# Patient Record
Sex: Female | Born: 1935 | Race: White | Hispanic: No | Marital: Married | Smoking: Never smoker
Health system: Southern US, Community
[De-identification: ages and names within clinical notes are randomized; demographics above are authoritative.]

## PROBLEM LIST (undated history)

## (undated) DIAGNOSIS — E785 Hyperlipidemia, unspecified: Secondary | ICD-10-CM

## (undated) DIAGNOSIS — M1990 Unspecified osteoarthritis, unspecified site: Secondary | ICD-10-CM

## (undated) DIAGNOSIS — K759 Inflammatory liver disease, unspecified: Secondary | ICD-10-CM

---

## 2008-06-14 ENCOUNTER — Ambulatory Visit: Payer: Self-pay | Admitting: Internal Medicine

## 2010-05-15 ENCOUNTER — Observation Stay: Payer: Self-pay | Admitting: Specialist

## 2013-06-10 ENCOUNTER — Ambulatory Visit: Payer: Self-pay | Admitting: Family Medicine

## 2013-07-11 ENCOUNTER — Ambulatory Visit: Payer: Self-pay | Admitting: Podiatry

## 2017-09-26 NOTE — Discharge Instructions (Signed)
[facility] °[facility] ° °POST OPERATIVE INSTRUCTIONS FOR DR. TROXLER AND DR. FOWLER °[facility] ° ° °1. Take your medication as prescribed.  Pain medication should be taken only as needed. ° °2. Keep the dressing clean, dry and intact. ° °3. Keep your foot elevated above the heart level for the first 48 hours. ° °4. Walking to the bathroom and brief periods of walking are acceptable, unless we have instructed you to be non-weight bearing. ° °5. Always wear your post-op shoe when walking.  Always use your crutches if you are to be non-weight bearing. ° °6. Do not take a shower. Baths are permissible as long as the foot is kept out of the water.  ° °7. Every hour you are awake:  °- Bend your knee 15 times. °- Flex foot 15 times °- Massage calf 15 times ° °8. Call [facility] ([phone]) if any of the following problems occur: °- You develop a temperature or fever. °- The bandage becomes saturated with blood. °- Medication does not stop your pain. °- Injury of the foot occurs. °- Any symptoms of infection including redness, odor, or red streaks running from wound. ° ° °General Anesthesia, Adult, Care After °These instructions provide you with information about caring for yourself after your procedure. Your health care provider may also give you more specific instructions. Your treatment has been planned according to current medical practices, but problems sometimes occur. Call your health care provider if you have any problems or questions after your procedure. °What can I expect after the procedure? °After the procedure, it is common to have: °· Vomiting. °· A sore throat. °· Mental slowness. ° °It is common to feel: °· Nauseous. °· Cold or shivery. °· Sleepy. °· Tired. °· Sore or achy, even in parts of your body where you did not have surgery. ° °Follow these instructions at home: °For at least 24 hours after the procedure: °· Do not: °? Participate in  activities where you could fall or become injured. °? Drive. °? Use heavy machinery. °? Drink alcohol. °? Take sleeping pills or medicines that cause drowsiness. °? Make important decisions or sign legal documents. °? Take care of children on your own. °· Rest. °Eating and drinking °· If you vomit, drink water, juice, or soup when you can drink without vomiting. °· Drink enough fluid to keep your urine clear or pale yellow. °· Make sure you have little or no nausea before eating solid foods. °· Follow the diet recommended by your health care provider. °General instructions °· Have a responsible adult stay with you until you are awake and alert. °· Return to your normal activities as told by your health care provider. Ask your health care provider what activities are safe for you. °· Take over-the-counter and prescription medicines only as told by your health care provider. °· If you smoke, do not smoke without supervision. °· Keep all follow-up visits as told by your health care provider. This is important. °Contact a health care provider if: °· You continue to have nausea or vomiting at home, and medicines are not helpful. °· You cannot drink fluids or start eating again. °· You cannot urinate after 8-12 hours. °· You develop a skin rash. °· You have fever. °· You have increasing redness at the site of your procedure. °Get help right away if: °· You have difficulty breathing. °· You have chest pain. °· You have unexpected bleeding. °· You feel that you   are having a life-threatening or urgent problem. °This information is not intended to replace advice given to you by your health care provider. Make sure you discuss any questions you have with your health care provider. °Document Released: 01/30/2001 Document Revised: 03/28/2016 Document Reviewed: 10/08/2015 °Elsevier Interactive Patient Education © 2018 [employer] ° °

## 2017-10-05 ENCOUNTER — Encounter
Admission: RE | Disposition: A | Discharge status: Home / Self Care | Payer: Self-pay | Source: Ambulatory Visit | Attending: Podiatry

## 2017-10-05 ENCOUNTER — Ambulatory Visit: Payer: Medicare Other | Admitting: Anesthesiology

## 2017-10-05 ENCOUNTER — Ambulatory Visit
Admission: RE | Admit: 2017-10-05 | Discharge: 2017-10-05 | Disposition: A | Discharge status: Home / Self Care | Payer: Medicare Other | Source: Ambulatory Visit | Attending: Podiatry | Admitting: Podiatry

## 2017-10-05 DIAGNOSIS — Z96651 Presence of right artificial knee joint: Secondary | ICD-10-CM | POA: Diagnosis not present

## 2017-10-05 DIAGNOSIS — N189 Chronic kidney disease, unspecified: Secondary | ICD-10-CM | POA: Diagnosis not present

## 2017-10-05 DIAGNOSIS — Z9889 Other specified postprocedural states: Secondary | ICD-10-CM | POA: Diagnosis not present

## 2017-10-05 DIAGNOSIS — M2011 Hallux valgus (acquired), right foot: Secondary | ICD-10-CM | POA: Insufficient documentation

## 2017-10-05 DIAGNOSIS — M19071 Primary osteoarthritis, right ankle and foot: Secondary | ICD-10-CM | POA: Insufficient documentation

## 2017-10-05 DIAGNOSIS — Z8619 Personal history of other infectious and parasitic diseases: Secondary | ICD-10-CM | POA: Insufficient documentation

## 2017-10-05 DIAGNOSIS — Z79899 Other long term (current) drug therapy: Secondary | ICD-10-CM | POA: Insufficient documentation

## 2017-10-05 DIAGNOSIS — E7800 Pure hypercholesterolemia, unspecified: Secondary | ICD-10-CM | POA: Diagnosis not present

## 2017-10-05 HISTORY — DX: Hyperlipidemia, unspecified: E78.5

## 2017-10-05 HISTORY — DX: Inflammatory liver disease, unspecified: K75.9

## 2017-10-05 HISTORY — PX: ARTHRODESIS METATARSALPHALANGEAL JOINT (MTPJ): SHX6566

## 2017-10-05 HISTORY — DX: Unspecified osteoarthritis, unspecified site: M19.90

## 2017-10-05 SURGERY — FUSION, JOINT, GREAT TOE
Anesthesia: General | Laterality: Right | Wound class: Clean

## 2017-10-05 MED ORDER — MEPERIDINE HCL 25 MG/ML IJ SOLN: 6.2500 mg | INTRAMUSCULAR | Status: DC | PRN

## 2017-10-05 MED ORDER — EPHEDRINE SULFATE 50 MG/ML IJ SOLN
INTRAMUSCULAR | Status: DC | PRN
  Administered 2017-10-05: 5 mg via INTRAVENOUS
  Administered 2017-10-05: 10 mg via INTRAVENOUS

## 2017-10-05 MED ORDER — FENTANYL CITRATE (PF) 100 MCG/2ML IJ SOLN
25.0000 ug | INTRAMUSCULAR | Status: DC | PRN
Start: 2017-10-05 — End: 2017-10-05

## 2017-10-05 MED ORDER — PROPOFOL 10 MG/ML IV BOLUS
INTRAVENOUS | Status: DC | PRN
  Administered 2017-10-05: 80 mg via INTRAVENOUS

## 2017-10-05 MED ORDER — OXYCODONE HCL 5 MG PO TABS: 5.0000 mg | ORAL_TABLET | Freq: Once | ORAL | Status: DC | PRN

## 2017-10-05 MED ORDER — FENTANYL CITRATE (PF) 100 MCG/2ML IJ SOLN
INTRAMUSCULAR | Status: DC | PRN
  Administered 2017-10-05: 25 ug via INTRAVENOUS

## 2017-10-05 MED ORDER — LIDOCAINE HCL (CARDIAC) 20 MG/ML IV SOLN
INTRAVENOUS | Status: DC | PRN
  Administered 2017-10-05: 50 mg via INTRATRACHEAL

## 2017-10-05 MED ORDER — CEFAZOLIN SODIUM-DEXTROSE 2-4 GM/100ML-% IV SOLN
2.0000 g | Freq: Once | INTRAVENOUS | Status: AC
  Administered 2017-10-05: 2 g via INTRAVENOUS

## 2017-10-05 MED ORDER — GLYCOPYRROLATE 0.2 MG/ML IJ SOLN
INTRAMUSCULAR | Status: DC | PRN
  Administered 2017-10-05: 0.1 mg via INTRAVENOUS

## 2017-10-05 MED ORDER — HYDROCODONE-ACETAMINOPHEN 7.5-325 MG PO TABS: 1.0000 | ORAL_TABLET | Freq: Four times a day (QID) | ORAL | 0 refills | Status: AC | PRN

## 2017-10-05 MED ORDER — ONDANSETRON HCL 4 MG/2ML IJ SOLN
INTRAMUSCULAR | Status: DC | PRN
  Administered 2017-10-05: 4 mg via INTRAVENOUS

## 2017-10-05 MED ORDER — OXYCODONE HCL 5 MG/5ML PO SOLN: 5.0000 mg | Freq: Once | ORAL | Status: DC | PRN

## 2017-10-05 MED ORDER — MIDAZOLAM HCL 5 MG/5ML IJ SOLN
INTRAMUSCULAR | Status: DC | PRN
  Administered 2017-10-05: 2 mg via INTRAVENOUS

## 2017-10-05 MED ORDER — BUPIVACAINE LIPOSOME 1.3 % IJ SUSP
INTRAMUSCULAR | Status: DC | PRN
  Administered 2017-10-05: 10 mL

## 2017-10-05 MED ORDER — BUPIVACAINE HCL (PF) 0.5 % IJ SOLN
INTRAMUSCULAR | Status: DC | PRN
  Administered 2017-10-05: 8 mL

## 2017-10-05 MED ORDER — LACTATED RINGERS IV SOLN
10.0000 mL/h | INTRAVENOUS | Status: DC
  Administered 2017-10-05: 09:00:00 via INTRAVENOUS
  Administered 2017-10-05: 10 mL/h via INTRAVENOUS

## 2017-10-05 MED ORDER — PROMETHAZINE HCL 25 MG/ML IJ SOLN: 6.2500 mg | INTRAMUSCULAR | Status: DC | PRN

## 2017-10-05 MED ORDER — DEXAMETHASONE SODIUM PHOSPHATE 4 MG/ML IJ SOLN
INTRAMUSCULAR | Status: DC | PRN
  Administered 2017-10-05: 4 mg via INTRAVENOUS

## 2017-10-05 SURGICAL SUPPLY — 60 items
BANDAGE ELASTIC 4 LF NS (GAUZE/BANDAGES/DRESSINGS) ×3 IMPLANT
BENZOIN TINCTURE PRP APPL 2/3 (GAUZE/BANDAGES/DRESSINGS) ×3 IMPLANT
BIT DRILL 2 FENESTRATED (MISCELLANEOUS) ×1 IMPLANT
BIT DRILL CANNULTD 2.6 X 130MM (DRILL) ×1 IMPLANT
BIT DRILL SOLID 2.0 X 110MM (DRILL) ×1 IMPLANT
BIT DRILLL 2 FENESTRATED (MISCELLANEOUS) ×2
BLADE OSC/SAGITTAL MD 9X18.5 (BLADE) ×3 IMPLANT
BNDG ESMARK 4X12 TAN STRL LF (GAUZE/BANDAGES/DRESSINGS) ×3 IMPLANT
BNDG GAUZE 4.5X4.1 6PLY STRL (MISCELLANEOUS) ×3 IMPLANT
BNDG STRETCH 4X75 STRL LF (GAUZE/BANDAGES/DRESSINGS) ×3 IMPLANT
CANISTER SUCT 1200ML W/VALVE (MISCELLANEOUS) ×3 IMPLANT
CAST PADDING 3X4FT ST 30246 (SOFTGOODS) ×4
CASTING MATERIAL DELTA LITE (CAST SUPPLIES) ×9 IMPLANT
CLOSURE WOUND 1/4X4 (GAUZE/BANDAGES/DRESSINGS) ×1
CONE REAMER 19MM (MISCELLANEOUS) ×3 IMPLANT
COUNTERSICK 4.0 HEADED (MISCELLANEOUS) ×3
COVER LIGHT HANDLE UNIVERSAL (MISCELLANEOUS) ×6 IMPLANT
CUFF TOURN SGL QUICK 18 (TOURNIQUET CUFF) ×3 IMPLANT
DRAPE FLUOR MINI C-ARM 54X84 (DRAPES) ×3 IMPLANT
DRILL CANNULATED 2.6 X 130MM (DRILL) ×3
DRILL SOLID 2.0 X 110MM (DRILL) ×3
DURAPREP 26ML APPLICATOR (WOUND CARE) ×3 IMPLANT
GAUZE PETRO XEROFOAM 1X8 (MISCELLANEOUS) ×3 IMPLANT
GAUZE SPONGE 4X4 12PLY STRL (GAUZE/BANDAGES/DRESSINGS) ×3 IMPLANT
GLOVE BIO SURGEON STRL SZ8 (GLOVE) ×3 IMPLANT
GOWN STRL REUS W/ TWL LRG LVL3 (GOWN DISPOSABLE) ×1 IMPLANT
GOWN STRL REUS W/ TWL XL LVL3 (GOWN DISPOSABLE) ×1 IMPLANT
GOWN STRL REUS W/TWL LRG LVL3 (GOWN DISPOSABLE) ×2
GOWN STRL REUS W/TWL XL LVL3 (GOWN DISPOSABLE) ×2
K-WIRE DBL END TROCAR 6X.045 (WIRE)
K-WIRE DBL END TROCAR 6X.062 (WIRE)
K-WIRE SMOOTH 1.6X150MM (WIRE) ×6
K-WIRE SNGL END 1.2X150 (MISCELLANEOUS) ×6
KIT ROOM TURNOVER OR (KITS) ×3 IMPLANT
KWIRE DBL END TROCAR 6X.045 (WIRE) IMPLANT
KWIRE DBL END TROCAR 6X.062 (WIRE) IMPLANT
KWIRE SMOOTH 1.6X150MM (WIRE) ×2 IMPLANT
KWIRE SNGL END 1.2X150 (MISCELLANEOUS) ×2 IMPLANT
NEEDLE HYPO 18GX1.5 BLUNT FILL (NEEDLE) ×3 IMPLANT
NEEDLE HYPO 25GX1X1/2 BEV (NEEDLE) ×3 IMPLANT
NS IRRIG 500ML POUR BTL (IV SOLUTION) ×3 IMPLANT
PACK EXTREMITY ARMC (MISCELLANEOUS) ×3 IMPLANT
PAD CAST CTTN 3X4 STRL (SOFTGOODS) ×2 IMPLANT
PAD GROUND ADULT SPLIT (MISCELLANEOUS) ×3 IMPLANT
PLATE MTP 0DEG RIGHT (Plate) ×3 IMPLANT
REAMER CUP 19MM FEMALE (MISCELLANEOUS) ×3 IMPLANT
SCREW 4.0X22ST (Screw) ×3 IMPLANT
SCREW COUNTERSINK 4.0 HEADED (MISCELLANEOUS) ×1 IMPLANT
SCREW LOCK PLATE R3 2.7X12 (Screw) ×3 IMPLANT
SCREW LOCK PLATE R3 2.7X14 (Screw) ×6 IMPLANT
SCREW LOCK PLATE R3 2.7X16 ×6 IMPLANT
SPLINT FAST PLASTER 5X30 (CAST SUPPLIES)
SPLINT PLASTER CAST FAST 5X30 (CAST SUPPLIES) IMPLANT
STOCKINETTE STRL 6IN 960660 (GAUZE/BANDAGES/DRESSINGS) ×3 IMPLANT
STRAP BODY AND KNEE 60X3 (MISCELLANEOUS) ×3 IMPLANT
STRIP CLOSURE SKIN 1/4X4 (GAUZE/BANDAGES/DRESSINGS) ×2 IMPLANT
SUT VIC AB 4-0 SH 27 (SUTURE) ×2
SUT VIC AB 4-0 SH 27XANBCTRL (SUTURE) ×1 IMPLANT
SYRINGE 10CC LL (SYRINGE) ×3 IMPLANT
WIRE OLIVE SMOOTH 1.4MMX60MM (WIRE) ×6 IMPLANT

## 2017-10-05 NOTE — Op Note (Signed)
Operative note   Surgeon: Dr. Recardo EvangelistMatthew Kanen Mottola, DPM.    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 Arthrodesis first MTP joints right foot          EBL: Less than 10 cc- 5 mL    Anesthesia:general delivered by the anesthesia team used to local block of 0.5% Marcaine plain 8 cc at the beginning of the case. At the end of the case were blocked with 10 cc ofexparelmixed with 0.5% Marcaine plain. This delivered the base of the operative site.    Hemostasis: Ankle tourniquet 225 mils mercury pressure for 60 minutes    Specimen: None    Complications: None    Operative indications: Chronic severe hallux valgus deformity unresponsive to conservative care patient also had significant osteoarthritis with cartilage erosion in the joint.    Procedure:  Patient was brought into the OR and placed on the operating table in thesupine position. After anesthesia was obtained theright lower extremity was prepped and draped in usual sterile fashion.  Operative Report: At this time the attention was directed to the first MTP joint where a 6 cm linear incision made over the first MTP joint. This was deepened sharp blunt dissection bleeders clamped and bovied as acquired. Tissue was then incised longitudinally and reflected medio andlaterally. At this point a dorsal medial eminence of bone was resected from the first metatarsal head and rasped smoothly. At this point a And cone reamer was used to remove cartilage from the first metatarsal head and the proximal phalanx base. After irrigation the cartilage was sufficiently denuded to the subchondral bone and both areas were fenestrated with the drill. This point the great toe was placed in appropriate position for correction of the hallux valgus deformity and temporarily fixated there is checked forcing good position and correction were  noted. This point a plate and screw system from [employer] was utilized to fixate the great toe and stabilize it with a plate. A 40 screw was used for compression and fixation and a 5 hole plate with 2.7 screws was used for stabilization. There is checked FluoroScan good position and correction were noted. This time after copious irrigation deep tissue and Kling capsule was closed with 4 Vicryl in continuous stitch as were deep superficial fascial layers. Skin was closed with 4 Vicryl subcuticular stitch.  This time there was blocked with the long-lasting anesthetic a sterile compressive dressings in placed across wound consisting of Steri-Strips Xeroform gauze 4 x 4's Kling and Kerlix. The tourniquet was released and prompt complete vascularity was seen to return to digits the right foot. At this point a below-knee cast with a pedestal walker was placed on the right foot and leg.    Patient tolerated the procedure and anesthesia well.  Was transported from the OR to the PACU with all vital signs stable and vascular status intact. To be discharged per routine protocol.  Will follow up in approximately 1 week in the outpatient clinic.

## 2017-10-05 NOTE — Anesthesia Postprocedure Evaluation (Signed)
Anesthesia Post Note  Patient: Danielle HeadySally Yokum  Procedure(s) Performed: ARTHRODESIS; 1st METATARSALPHALANGEAL JOINT (MTPJ) (Right )  Patient location during evaluation: PACU Anesthesia Type: General Level of consciousness: awake and alert, oriented and patient cooperative Pain management: pain level controlled Vital Signs Assessment: post-procedure vital signs reviewed and stable Respiratory status: spontaneous breathing, nonlabored ventilation and respiratory function stable Cardiovascular status: blood pressure returned to baseline and stable Postop Assessment: adequate PO intake Anesthetic complications: no    Reed BreechAndrea Dailyn Reith

## 2017-10-05 NOTE — Anesthesia Preprocedure Evaluation (Signed)
Anesthesia Evaluation  Patient identified by MRN, date of birth, ID band Patient awake    History of Anesthesia Complications Negative for: history of anesthetic complications  Airway Mallampati: I  TM Distance: >3 FB Neck ROM: Full    Dental no notable dental hx. (+) Teeth Intact   Pulmonary neg pulmonary ROS,    Pulmonary exam normal breath sounds clear to auscultation       Cardiovascular Exercise Tolerance: Good negative cardio ROS Normal cardiovascular exam Rhythm:Regular Rate:Normal     Neuro/Psych negative neurological ROS     GI/Hepatic negative GI ROS, (+) Hepatitis -, B  Endo/Other  negative endocrine ROS  Renal/GU negative Renal ROS     Musculoskeletal  (+) Arthritis ,   Abdominal   Peds  Hematology negative hematology ROS (+)   Anesthesia Other Findings   Reproductive/Obstetrics                             Anesthesia Physical Anesthesia Plan  ASA: II  Anesthesia Plan: General   Post-op Pain Management:    Induction: Intravenous  PONV Risk Score and Plan: 2 and Ondansetron and Dexamethasone  Airway Management Planned: LMA  Additional Equipment:   Intra-op Plan:   Post-operative Plan: Extubation in OR  Informed Consent: I have reviewed the patients History and Physical, chart, labs and discussed the procedure including the risks, benefits and alternatives for the proposed anesthesia with the patient or authorized representative who has indicated his/her understanding and acceptance.     Plan Discussed with: CRNA  Anesthesia Plan Comments:         Anesthesia Quick Evaluation

## 2017-10-05 NOTE — H&P (Signed)
H and P has been reviewed and no changes are noted.  

## 2017-10-05 NOTE — Transfer of Care (Signed)
Immediate Anesthesia Transfer of Care Note  Patient: Danielle Mccarty  Procedure(s) Performed: ARTHRODESIS; 1st METATARSALPHALANGEAL JOINT (MTPJ) (Right )  Patient Location: PACU  Anesthesia Type: General  Level of Consciousness: awake, alert  and patient cooperative  Airway and Oxygen Therapy: Patient Spontanous Breathing and Patient connected to supplemental oxygen  Post-op Assessment: Post-op Vital signs reviewed, Patient's Cardiovascular Status Stable, Respiratory Function Stable, Patent Airway and No signs of Nausea or vomiting  Post-op Vital Signs: Reviewed and stable  Complications: No apparent anesthesia complications

## 2017-10-05 NOTE — Anesthesia Procedure Notes (Signed)
Procedure Name: LMA Insertion Date/Time: 10/05/2017 7:45 AM Performed by: Jimmy PicketAmyot, Loren Vicens, CRNA Pre-anesthesia Checklist: Patient identified, Emergency Drugs available, Suction available, Timeout performed and Patient being monitored Patient Re-evaluated:Patient Re-evaluated prior to induction Oxygen Delivery Method: Circle system utilized Preoxygenation: Pre-oxygenation with 100% oxygen Induction Type: IV induction LMA: LMA inserted LMA Size: 4.0 Number of attempts: 1 Placement Confirmation: positive ETCO2 and breath sounds checked- equal and bilateral Tube secured with: Tape

## 2017-10-06 ENCOUNTER — Encounter: Payer: Self-pay | Admitting: Podiatry

## 2018-04-27 ENCOUNTER — Other Ambulatory Visit: Payer: Self-pay | Admitting: Family Medicine

## 2018-04-27 DIAGNOSIS — Z780 Asymptomatic menopausal state: Secondary | ICD-10-CM

## 2018-07-25 ENCOUNTER — Other Ambulatory Visit: Payer: Self-pay | Admitting: Family Medicine

## 2018-07-25 DIAGNOSIS — Z1231 Encounter for screening mammogram for malignant neoplasm of breast: Secondary | ICD-10-CM

## 2018-08-21 ENCOUNTER — Other Ambulatory Visit: Payer: Medicare Other

## 2018-09-17 ENCOUNTER — Other Ambulatory Visit: Payer: Medicare Other

## 2018-11-08 ENCOUNTER — Ambulatory Visit
Admission: RE | Admit: 2018-11-08 | Discharge: 2018-11-08 | Disposition: A | Discharge status: Home / Self Care | Payer: Medicare Other | Source: Ambulatory Visit | Attending: Family Medicine | Admitting: Family Medicine

## 2018-11-08 DIAGNOSIS — Z780 Asymptomatic menopausal state: Secondary | ICD-10-CM | POA: Diagnosis present

## 2019-05-16 ENCOUNTER — Other Ambulatory Visit: Payer: Self-pay | Admitting: *Deleted

## 2019-05-16 DIAGNOSIS — Z20822 Contact with and (suspected) exposure to covid-19: Secondary | ICD-10-CM

## 2019-05-22 LAB — NOVEL CORONAVIRUS, NAA: SARS-CoV-2, NAA: NOT DETECTED

## 2019-05-24 ENCOUNTER — Telehealth: Payer: Self-pay | Admitting: General Practice

## 2019-05-24 NOTE — Telephone Encounter (Signed)
Patient was advised of her negative COVID results. Patient expressed understanding. °

## 2024-08-02 ENCOUNTER — Ambulatory Visit
Admission: RE | Admit: 2024-08-02 | Discharge: 2024-08-02 | Disposition: A | Discharge status: Home / Self Care | Source: Ambulatory Visit | Attending: Family Medicine | Admitting: Family Medicine

## 2024-08-02 ENCOUNTER — Other Ambulatory Visit: Payer: Self-pay | Admitting: Family Medicine

## 2024-08-02 DIAGNOSIS — R600 Localized edema: Secondary | ICD-10-CM | POA: Insufficient documentation
